# Patient Record
Sex: Male | Born: 1955 | Race: White | Hispanic: No | Marital: Married | Smoking: Never smoker
Health system: Southern US, Community
[De-identification: ages and names within clinical notes are randomized; demographics above are authoritative.]

## PROBLEM LIST (undated history)

## (undated) DIAGNOSIS — I10 Essential (primary) hypertension: Secondary | ICD-10-CM

## (undated) DIAGNOSIS — N2889 Other specified disorders of kidney and ureter: Secondary | ICD-10-CM

## (undated) DIAGNOSIS — C801 Malignant (primary) neoplasm, unspecified: Secondary | ICD-10-CM

## (undated) DIAGNOSIS — E7800 Pure hypercholesterolemia, unspecified: Secondary | ICD-10-CM

## (undated) DIAGNOSIS — E119 Type 2 diabetes mellitus without complications: Secondary | ICD-10-CM

## (undated) HISTORY — PX: RENAL CRYOABLATION: SHX2322

---

## 1996-05-02 HISTORY — PX: OTHER SURGICAL HISTORY: SHX169

## 2014-08-18 ENCOUNTER — Other Ambulatory Visit (HOSPITAL_BASED_OUTPATIENT_CLINIC_OR_DEPARTMENT_OTHER): Payer: Self-pay | Admitting: Family Medicine

## 2014-08-18 DIAGNOSIS — R509 Fever, unspecified: Secondary | ICD-10-CM

## 2014-08-18 DIAGNOSIS — D72820 Lymphocytosis (symptomatic): Secondary | ICD-10-CM

## 2014-08-19 ENCOUNTER — Other Ambulatory Visit (HOSPITAL_BASED_OUTPATIENT_CLINIC_OR_DEPARTMENT_OTHER): Payer: 59

## 2014-08-20 ENCOUNTER — Other Ambulatory Visit (HOSPITAL_BASED_OUTPATIENT_CLINIC_OR_DEPARTMENT_OTHER): Payer: Self-pay | Admitting: Family Medicine

## 2014-08-20 ENCOUNTER — Telehealth: Payer: Self-pay | Admitting: Hematology & Oncology

## 2014-08-20 DIAGNOSIS — A689 Relapsing fever, unspecified: Secondary | ICD-10-CM

## 2014-08-20 DIAGNOSIS — R7989 Other specified abnormal findings of blood chemistry: Secondary | ICD-10-CM

## 2014-08-20 NOTE — Telephone Encounter (Signed)
Wife called wanting our MD to see pt. They are getting the run around from [facility] Dr. Creig Hines office. Wife is aware to get records sent to [place] and we will see the pt or to come in and fill out medical release and we will get the records.

## 2014-08-21 ENCOUNTER — Ambulatory Visit (HOSPITAL_BASED_OUTPATIENT_CLINIC_OR_DEPARTMENT_OTHER)
Admission: RE | Admit: 2014-08-21 | Discharge: 2014-08-21 | Disposition: A | Discharge status: Home / Self Care | Payer: 59 | Source: Ambulatory Visit | Attending: Family Medicine | Admitting: Family Medicine

## 2014-08-21 DIAGNOSIS — R7989 Other specified abnormal findings of blood chemistry: Secondary | ICD-10-CM

## 2014-08-21 DIAGNOSIS — D72820 Lymphocytosis (symptomatic): Secondary | ICD-10-CM

## 2014-08-21 DIAGNOSIS — D72829 Elevated white blood cell count, unspecified: Secondary | ICD-10-CM | POA: Diagnosis not present

## 2014-08-21 DIAGNOSIS — R109 Unspecified abdominal pain: Secondary | ICD-10-CM | POA: Insufficient documentation

## 2014-08-21 DIAGNOSIS — R509 Fever, unspecified: Secondary | ICD-10-CM | POA: Diagnosis not present

## 2014-08-21 DIAGNOSIS — A689 Relapsing fever, unspecified: Secondary | ICD-10-CM

## 2014-08-21 MED ORDER — IOHEXOL 300 MG/ML  SOLN
100.0000 mL | Freq: Once | INTRAMUSCULAR | Status: AC | PRN
  Administered 2014-08-21: 100 mL via INTRAVENOUS

## 2014-08-25 ENCOUNTER — Telehealth: Payer: Self-pay | Admitting: Hematology & Oncology

## 2014-08-25 NOTE — Telephone Encounter (Signed)
Wife left message pt got CT and will follow up with surgeon. Dr. Marin Olp has seen the CT results and agrees he doesn't need to see pt at this time.

## 2014-08-27 ENCOUNTER — Telehealth: Payer: Self-pay | Admitting: Hematology & Oncology

## 2014-08-27 ENCOUNTER — Telehealth: Payer: Self-pay | Admitting: *Deleted

## 2014-08-27 NOTE — Telephone Encounter (Signed)
Received phone call from Patient wife who is a Designer, jewellery.  She is concerned about when patient was hospitalized back in March for Sepsis- EColi in his blood and his labs (platelets and Wbc) fluctuated.  She is concerned with why he had this and states she cant get answers from anyone.  She wants to see Dr. Niel Hummer for consult but does not want me to call the other offices for notes, etc because it hurts her NP practice.  She states she will send [place] notes herself from appropriate offices and we will check with Dr. Marin Olp to see if he thinks patient should be seen.This was satisfactory with the patient

## 2014-08-27 NOTE — Telephone Encounter (Signed)
i transferred call to RN pt's there is something going on with pt's caregivers and his wife.

## 2014-09-30 ENCOUNTER — Other Ambulatory Visit: Payer: Self-pay | Admitting: Urology

## 2014-09-30 DIAGNOSIS — N2889 Other specified disorders of kidney and ureter: Secondary | ICD-10-CM

## 2014-10-23 ENCOUNTER — Ambulatory Visit
Admission: RE | Admit: 2014-10-23 | Discharge: 2014-10-23 | Disposition: A | Discharge status: Home / Self Care | Payer: 59 | Source: Ambulatory Visit | Attending: Urology | Admitting: Urology

## 2014-10-23 DIAGNOSIS — N2889 Other specified disorders of kidney and ureter: Secondary | ICD-10-CM | POA: Insufficient documentation

## 2014-10-23 HISTORY — DX: Other specified disorders of kidney and ureter: N28.89

## 2014-10-23 HISTORY — DX: Pure hypercholesterolemia, unspecified: E78.00

## 2014-10-23 HISTORY — DX: Essential (primary) hypertension: I10

## 2014-10-23 NOTE — Consult Note (Signed)
Chief Complaint: Chief Complaint  Patient presents with  . Advice Only    Consult for Left Renal Cryoablation      Referring Physician(s): Hall,Marshall C  History of Present Illness: Terry Hunt is a 59 y.o. male who was recently evaluated for abdominal pain which led to a CT scan. The CT scan discovered an incidental 1.8 cm left upper pole solid enhancing renal mass compatible with a renal cell carcinoma. This was further evaluated with MRI again confirming a 2 cm enhancing solid exophytic left upper pole renal mass consistent with a renal cell carcinoma. He remains asymptomatic. No flank pain, abdominal pain, dysuria or hematuria. He is referred from Urology to discuss interventional therapies including image guided cryoablation.  Past Medical History  Diagnosis Date  . Left renal mass   . Hypertension   . Hypercholesterolemia     Past Surgical History  Procedure Laterality Date  . Left hand closed reduction    1998      Allergies: Review of patient's allergies indicates no known allergies.  Medications: Prior to Admission medications   Medication Sig Start Date End Date Taking? Authorizing Provider  aspirin 81 MG tablet Take 81 mg by mouth daily.   Yes Historical Provider, MD  esomeprazole (NEXIUM) 40 MG capsule Take 40 mg by mouth daily at 12 noon.   Yes Historical Provider, MD  losartan-hydrochlorothiazide (HYZAAR) 100-25 MG per tablet Take 1 tablet by mouth daily.   Yes Historical Provider, MD  simvastatin (ZOCOR) 20 MG tablet Take 20 mg by mouth daily.   Yes Historical Provider, MD     No family history on file.  History   Social History  . Marital Status: Married    Spouse Name: N/A  . Number of Children: N/A  . Years of Education: N/A   Social History Main Topics  . Smoking status: Never Smoker   . Smokeless tobacco: Never Used  . Alcohol Use: No  . Drug Use: No  . Sexual Activity: Not on file   Other Topics Concern  . None   Social History  Narrative  . None    ECOG Status: 0 - Asymptomatic  Review of Systems: A 12 point ROS discussed and pertinent positives are indicated in the HPI above.  All other systems are negative.  Review of Systems  Vital Signs: BP 178/91 mmHg  Pulse 76  Temp(Src) 98.2 F (36.8 C) (Oral)  Resp 14  Ht 6' (1.829 m)  Wt 190 lb (86.183 kg)  BMI 25.76 kg/m2  SpO2 98%  Physical Exam  Constitutional: He appears well-developed and well-nourished. No distress.  Cardiovascular: Normal rate, regular rhythm and normal heart sounds.  Exam reveals no friction rub.   No murmur heard. Pulmonary/Chest: Effort normal and breath sounds normal. No respiratory distress. He has no wheezes.  Abdominal: Soft. Bowel sounds are normal. He exhibits no distension and no mass.  Skin: He is not diaphoretic.     Imaging: Recent CT and MRI scans were reviewed again confirming a 2 cm solid enhancing left upper pole renal mass compatible with a small renal cell carcinoma.   Labs: Creatinine 0.9 09/03/2014   Assessment and Plan:  2 cm left upper pole posterior renal cell carcinoma by imaging. Posterior peripheral location is amenable to image guided cryoablation. Treatment options including partial nephrectomy, surveillance, and cryoablation were reviewed. All questions were addressed. Our discussion centered around cryoablation. The procedure, risks, benefits, alternatives, outcomes and surveillance were reviewed with the patient  and his spouse. They have a clear understanding of the procedure. He would like to proceed with the treatment at [facility]. After our discussion, this will be scheduled in the next few weeks.  Thank you for this interesting consult.  I greatly enjoyed meeting Terry Hunt and look forward to participating in their care.  SignedGreggory Keen 10/23/2014, 2:10 PM   I spent a total of  30 Minutes   in face to face in clinical consultation, greater than 50% of which was  counseling/coordinating care for this patient with a 2 cm left renal cell carcinoma

## 2014-11-20 ENCOUNTER — Other Ambulatory Visit (HOSPITAL_COMMUNITY): Payer: Self-pay | Admitting: Interventional Radiology

## 2014-11-20 DIAGNOSIS — N2889 Other specified disorders of kidney and ureter: Secondary | ICD-10-CM

## 2015-02-02 ENCOUNTER — Other Ambulatory Visit: Payer: Self-pay | Admitting: Radiology

## 2015-02-02 ENCOUNTER — Other Ambulatory Visit (HOSPITAL_COMMUNITY): Payer: Self-pay | Admitting: Interventional Radiology

## 2015-02-02 DIAGNOSIS — N2889 Other specified disorders of kidney and ureter: Secondary | ICD-10-CM

## 2015-02-10 ENCOUNTER — Other Ambulatory Visit (HOSPITAL_COMMUNITY): Payer: Self-pay | Admitting: Interventional Radiology

## 2015-02-10 LAB — BUN: BUN: 15 mg/dL (ref 7–25)

## 2015-02-10 LAB — CREATININE WITH EST GFR
CREATININE: 0.97 mg/dL (ref 0.70–1.25)
GFR, Est African American: >89 mL/min (ref >=60)
GFR, Est Non African American: 84 mL/min (ref >=60)

## 2015-02-13 ENCOUNTER — Ambulatory Visit (HOSPITAL_BASED_OUTPATIENT_CLINIC_OR_DEPARTMENT_OTHER)
Admission: RE | Admit: 2015-02-13 | Discharge: 2015-02-13 | Disposition: A | Discharge status: Home / Self Care | Payer: 59 | Source: Ambulatory Visit | Attending: Interventional Radiology | Admitting: Interventional Radiology

## 2015-02-13 DIAGNOSIS — C642 Malignant neoplasm of left kidney, except renal pelvis: Secondary | ICD-10-CM | POA: Insufficient documentation

## 2015-02-13 DIAGNOSIS — N2889 Other specified disorders of kidney and ureter: Secondary | ICD-10-CM

## 2015-02-13 MED ORDER — IOHEXOL 350 MG/ML SOLN
100.0000 mL | Freq: Once | INTRAVENOUS | Status: AC | PRN
  Administered 2015-02-13: 100 mL via INTRAVENOUS

## 2015-02-20 ENCOUNTER — Other Ambulatory Visit (HOSPITAL_BASED_OUTPATIENT_CLINIC_OR_DEPARTMENT_OTHER): Payer: 59

## 2015-02-24 ENCOUNTER — Ambulatory Visit
Admission: RE | Admit: 2015-02-24 | Discharge: 2015-02-24 | Disposition: A | Discharge status: Home / Self Care | Payer: 59 | Source: Ambulatory Visit | Attending: Interventional Radiology | Admitting: Interventional Radiology

## 2015-02-24 DIAGNOSIS — N2889 Other specified disorders of kidney and ureter: Secondary | ICD-10-CM

## 2015-02-24 NOTE — Progress Notes (Signed)
Patient ID: Terry Hunt, male   DOB: October 31, 1955, 59 y.o.   MRN: [id number]       Chief Complaint: Patient was seen in consultation today for  Chief Complaint  Patient presents with  . Follow-up    Cryoablation of Left Renal Mass   at the request of Deangleo Passage  Referring Physician(s): hall  History of Present Illness: Terry Hunt is a 59 y.o. male  3 months status post left renal cell carcinoma cryoablation. Procedure was performed at [facility]. He was discharged the same day. He has recovered at home very well. No current abdominal or flank pain. No dysuria or hematuria. No current urinary tract symptoms. He currently is asymptomatic. Follow-up CT imaging demonstrates no significant residual or recurrent tumor. No delay, location or hydronephrosis. He returns for outpatient follow-up.   Past Medical History  Diagnosis Date  . Left renal mass   . Hypertension   . Hypercholesterolemia     Past Surgical History  Procedure Laterality Date  . Left hand closed reduction    1998      Allergies: Review of patient's allergies indicates no known allergies.  Medications: Prior to Admission medications   Medication Sig Start Date End Date Taking? Authorizing Provider  aspirin 81 MG tablet Take 81 mg by mouth daily.    Historical Provider, MD  esomeprazole (NEXIUM) 40 MG capsule Take 40 mg by mouth daily at 12 noon.    Historical Provider, MD  losartan-hydrochlorothiazide (HYZAAR) 100-25 MG per tablet Take 1 tablet by mouth daily.    Historical Provider, MD  simvastatin (ZOCOR) 20 MG tablet Take 20 mg by mouth daily.    Historical Provider, MD     No family history on file.  Social History   Social History  . Marital Status: Married    Spouse Name: N/A  . Number of Children: N/A  . Years of Education: N/A   Social History Main Topics  . Smoking status: Never Smoker   . Smokeless tobacco: Never Used  . Alcohol Use: No  . Drug Use: No  . Sexual Activity: Not on  file   Other Topics Concern  . Not on file   Social History Narrative  . No narrative on file    ECOG Status: 0 - Asymptomatic  Review of Systems: A 12 point ROS discussed and pertinent positives are indicated in the HPI above.  All other systems are negative.  Review of Systems  Vital Signs: BP 170/88 mmHg  Pulse 63  Temp(Src) 98 F (36.7 C) (Oral)  Resp 14  SpO2 98%  Physical Exam  Constitutional: He appears well-developed and well-nourished. No distress.  Cardiovascular: Normal rate and regular rhythm.  Exam reveals no friction rub.   No murmur heard. Pulmonary/Chest: Effort normal and breath sounds normal. No respiratory distress. He has no wheezes.  Abdominal: Soft. Bowel sounds are normal. He exhibits no distension and no mass. There is no guarding.  Skin: He is not diaphoretic.     Imaging: Ct Abd Wo & W Cm  02/13/2015  CLINICAL DATA:  Status post RF ablation of a left upper pole renal cell carcinoma. EXAM: CT ABDOMEN WITHOUT AND WITH CONTRAST TECHNIQUE: Multidetector CT imaging of the abdomen was performed following the standard protocol before and following the bolus administration of intravenous contrast. CONTRAST:  171mL OMNIPAQUE IOHEXOL 350 MG/ML SOLN COMPARISON:  Ablation procedure CT scan 11/13/2014, MRI abdomen 09/12/2014 and CT scan 08/21/2014 FINDINGS: Lower chest: The lung bases are clear of  acute findings other than minimal dependent subpleural atelectasis. No worrisome pulmonary lesions or pleural effusion. The heart is normal in size. No pericardial effusion. The distal esophagus is grossly normal. There is a small hiatal hernia. Hepatobiliary: No focal hepatic lesions or intrahepatic biliary dilatation. The gallbladder is normal. No common bile duct dilatation. Pancreas: No mass, inflammation or ductal dilatation. Spleen: Normal size.  No focal lesions. Adrenals/Urinary Tract: The adrenal glands are normal and stable. The right kidney is normal and stable.  Post ablation changes involving the left upper pole renal lesion. It measures 19 x 18 mm. I do not see any appreciable enhancement after contrast administration to suggest recurrent or residual tumor. Stomach/Bowel: The stomach, duodenum, visualized small bowel and visualized colon are unremarkable. No inflammatory changes, mass lesions or obstructive findings. Vascular/Lymphatic: The aorta and branch vessels are patent. The major venous structures are patent. No mesenteric or retroperitoneal mass or lymphadenopathy. Other: No ascites or abdominal wall hernia. Musculoskeletal: No significant osseous abnormalities. IMPRESSION: Status post RF ablation of a left upper pole renal cell carcinoma. No CT findings to suggest residual or recurrent tumor. No findings for metastatic disease or adenopathy. Electronically Signed   By: Marijo Sanes M.D.   On: 02/13/2015 10:52    Labs:  CBC: No results for input(s): WBC, HGB, HCT, PLT in the last 8760 hours.  COAGS: No results for input(s): INR, APTT in the last 8760 hours.  BMP:  Recent Labs  02/10/15 0001  BUN 15  CREATININE 0.97  GFRNONAA 84  GFRAA >89     Assessment and Plan:  3 months status post left upper pole 2 cm renal cell carcinoma cryoablation. He has recovered fully. He remains asymptomatic. No current abdominal or flank pain. No hematuria or urinary tract symptoms. Repeat imaging 02/13/2015 demonstrates no residual or recurrent tumor. Stable ablation defect. No delay complication or hemorrhage. No hydronephrosis.  Plan: Schedule for follow-up repeat CT without and with contrast at [facility] [place] in 6 months. I will see him back in the office after the six-month CT to review his imaging.   Thank you for this interesting consult.  I greatly enjoyed meeting Eustacio Ellen and look forward to participating in their care.  A copy of this report was sent to the requesting provider on this date.  SignedGreggory Keen 02/24/2015, 4:50  PM   I spent a total of    15 Minutes in face to face in clinical consultation, greater than 50% of which was counseling/coordinating care for this patient with left renal cell carcinoma, status post cryoablation

## 2015-05-12 ENCOUNTER — Other Ambulatory Visit (HOSPITAL_COMMUNITY): Payer: Self-pay | Admitting: Interventional Radiology

## 2015-05-12 DIAGNOSIS — N2889 Other specified disorders of kidney and ureter: Secondary | ICD-10-CM

## 2015-07-21 ENCOUNTER — Other Ambulatory Visit: Payer: Self-pay | Admitting: *Deleted

## 2015-07-21 ENCOUNTER — Other Ambulatory Visit (HOSPITAL_COMMUNITY): Payer: Self-pay | Admitting: Interventional Radiology

## 2015-07-21 DIAGNOSIS — C642 Malignant neoplasm of left kidney, except renal pelvis: Secondary | ICD-10-CM

## 2015-08-07 ENCOUNTER — Other Ambulatory Visit: Payer: Self-pay | Admitting: *Deleted

## 2015-08-19 LAB — CREATININE WITH EST GFR
CREATININE: 0.97 mg/dL (ref 0.70–1.25)
GFR, Est Non African American: 84 mL/min (ref >=60)

## 2015-08-19 LAB — BUN: BUN: 14 mg/dL (ref 7–25)

## 2015-08-21 ENCOUNTER — Encounter (HOSPITAL_BASED_OUTPATIENT_CLINIC_OR_DEPARTMENT_OTHER): Payer: Self-pay

## 2015-08-21 ENCOUNTER — Ambulatory Visit (HOSPITAL_BASED_OUTPATIENT_CLINIC_OR_DEPARTMENT_OTHER)
Admission: RE | Admit: 2015-08-21 | Discharge: 2015-08-21 | Disposition: A | Discharge status: Home / Self Care | Payer: 59 | Source: Ambulatory Visit | Attending: Interventional Radiology | Admitting: Interventional Radiology

## 2015-08-21 DIAGNOSIS — C642 Malignant neoplasm of left kidney, except renal pelvis: Secondary | ICD-10-CM | POA: Diagnosis not present

## 2015-08-21 HISTORY — DX: Type 2 diabetes mellitus without complications: E11.9

## 2015-08-21 HISTORY — DX: Malignant (primary) neoplasm, unspecified: C80.1

## 2015-08-21 MED ORDER — IOPAMIDOL (ISOVUE-300) INJECTION 61%
100.0000 mL | Freq: Once | INTRAVENOUS | Status: AC | PRN
Start: 2015-08-21 — End: 2015-08-21
  Administered 2015-08-21: 100 mL via INTRAVENOUS

## 2015-08-26 ENCOUNTER — Ambulatory Visit
Admission: RE | Admit: 2015-08-26 | Discharge: 2015-08-26 | Disposition: A | Discharge status: Home / Self Care | Payer: 59 | Source: Ambulatory Visit | Attending: Interventional Radiology | Admitting: Interventional Radiology

## 2015-08-26 DIAGNOSIS — N2889 Other specified disorders of kidney and ureter: Secondary | ICD-10-CM

## 2015-08-26 NOTE — Progress Notes (Signed)
Patient ID: Terry Hunt, male   DOB: [birth year], 60 y.o.   MRN: [id number]       Chief Complaint: Left renal cell carcinoma, status post cryoablation.   Referring Physician(s): Heather Roberts  History of Present Illness: Terry Hunt is a 60 y.o. male 9 months status post left renal cell carcinoma cryoablation. He continues to do very well as an outpatient. No physical limitations. No current flank or abdominal pain. No dysuria or hematuria. No current urinary tract symptoms. He returns to review follow-up imaging as an outpatient.  Past Medical History  Diagnosis Date  . Left renal mass   . Hypertension   . Hypercholesterolemia   . Diabetes mellitus without complication ([place])   . Cancer [facility])     Past Surgical History  Procedure Laterality Date  . Left hand closed reduction    1998      Allergies: Review of patient's allergies indicates no known allergies.  Medications: Prior to Admission medications   Medication Sig Start Date End Date Taking? Authorizing Provider  aspirin 81 MG tablet Take 81 mg by mouth daily.   Yes Historical Provider, MD  esomeprazole (NEXIUM) 40 MG capsule Take 40 mg by mouth daily at 12 noon.   Yes Historical Provider, MD  losartan-hydrochlorothiazide (HYZAAR) 100-25 MG per tablet Take 1 tablet by mouth daily.   Yes Historical Provider, MD  metFORMIN (GLUCOPHAGE) 500 MG tablet Take by mouth 2 (two) times daily with a meal.   Yes Historical Provider, MD  simvastatin (ZOCOR) 20 MG tablet Take 20 mg by mouth daily.    Historical Provider, MD     No family history on file.  Social History   Social History  . Marital Status: Married    Spouse Name: N/A  . Number of Children: N/A  . Years of Education: N/A   Social History Main Topics  . Smoking status: Never Smoker   . Smokeless tobacco: Never Used  . Alcohol Use: No  . Drug Use: No  . Sexual Activity: Not on file   Other Topics Concern  . Not on file   Social History Narrative    ECOG  Status: 0 - Asymptomatic  Review of Systems: A 12 point ROS discussed and pertinent positives are indicated in the HPI above.  All other systems are negative.  Review of Systems  Vital Signs: BP 178/85 mmHg  Pulse 65  Temp(Src) 98.1 F (36.7 C) (Oral)  Resp 14  Ht 6\' 1"  (1.854 m)  Wt 185 lb (83.915 kg)  BMI 24.41 kg/m2  SpO2 97%  Physical Exam  Constitutional: He appears well-developed and well-nourished. No distress.  Cardiovascular: Normal rate and regular rhythm.  Exam reveals no friction rub.   No murmur heard. Pulmonary/Chest: Effort normal and breath sounds normal. No respiratory distress. He has no wheezes.  Abdominal: Soft. Bowel sounds are normal. He exhibits no distension and no mass. There is no tenderness. No hernia.  Skin: He is not diaphoretic.  Psychiatric: He has a normal mood and affect. His behavior is normal.    Imaging: Ct Abd Wo & W Cm  08/21/2015  CLINICAL DATA:  Left renal cell carcinoma, status post cryoablation on 11/13/2014. EXAM: CT ABDOMEN WITHOUT AND WITH CONTRAST TECHNIQUE: Multidetector CT imaging of the abdomen was performed following the standard protocol before and following the bolus administration of intravenous contrast. CONTRAST:  160mL ISOVUE-300 IOPAMIDOL (ISOVUE-300) INJECTION 61% COMPARISON:  02/13/2015. FINDINGS: Lower chest: Stable 3 mm nodule along the left major fissure  are compatible with subpleural lymph node. Hepatobiliary: No focal abnormality within the liver parenchyma. There is no evidence for gallstones, gallbladder wall thickening, or pericholecystic fluid. No intrahepatic or extrahepatic biliary dilation. Pancreas: No focal mass lesion. No dilatation of the main duct. No intraparenchymal cyst. No peripancreatic edema. Spleen: No splenomegaly. No focal mass lesion. Adrenals/Urinary Tract: No adrenal nodule or mass. Right kidney is unremarkable. Ablation defect in the upper pole left kidney posteriorly has decreased in size in the  interval, measuring 1.3 x 1.5 cm today compared at 1.8 x 1.9 cm previously. No definite enhancement within the ablation defect. No hydronephrosis in either kidney. Stomach/Bowel: Stomach is nondistended. No gastric wall thickening. No evidence of outlet obstruction. Duodenum is normally positioned as is the ligament of Treitz. Visualized abdominal bowel loops and colonic segments are unremarkable. Vascular/Lymphatic: There is abdominal aortic atherosclerosis without aneurysm. There is no gastrohepatic or hepatoduodenal ligament lymphadenopathy. No intraperitoneal or retroperitoneal lymphadenopathy. Other: No intraperitoneal free fluid. Musculoskeletal: Bone windows reveal no worrisome lytic or sclerotic osseous lesions. IMPRESSION: Continued further retraction of the ablation defect in the upper pole left kidney consistent with evolution of scarring. No features on today's study to raise concern for recurrent or metastatic disease. Electronically Signed   By: Misty Stanley M.D.   On: 08/21/2015 12:58    Labs:  BMP:  Recent Labs  02/10/15 0001 08/18/15 1459  BUN 15 14  CREATININE 0.97 0.97  GFRNONAA 84 84  GFRAA >89 >89     Assessment and Plan:  9 months status post left renal cell carcinoma CT-guided cryoablation. He continues to do very well. No current flank or abdominal pain. No dysuria or hematuria. No current urinary tract symptoms. He continues to work full-time. No physical limitations. Follow-up CT imaging demonstrates further retraction of the ablation defect. No signs of recurrence or residual tumor.  Plan: Repeat surveillance CT without and with contrast at [facility] [place] in 6 months. I will see him back in the office at that time.  Thank you for this interesting consult.  I greatly enjoyed meeting Terry Hunt and look forward to participating in their care.  A copy of this report was sent to the requesting provider on this date.  Electronically Signed: Greggory Keen 08/26/2015, 3:51 PM   I spent a total of    25 Minutes in face to face in clinical consultation, greater than 50% of which was counseling/coordinating care for this patient with a left renal cell carcinoma

## 2015-09-27 ENCOUNTER — Emergency Department (HOSPITAL_BASED_OUTPATIENT_CLINIC_OR_DEPARTMENT_OTHER)
Admission: EM | Admit: 2015-09-27 | Discharge: 2015-09-27 | Disposition: A | Discharge status: Home / Self Care | Payer: 59 | Attending: Emergency Medicine | Admitting: Emergency Medicine

## 2015-09-27 ENCOUNTER — Encounter (HOSPITAL_BASED_OUTPATIENT_CLINIC_OR_DEPARTMENT_OTHER): Payer: Self-pay

## 2015-09-27 DIAGNOSIS — Z7982 Long term (current) use of aspirin: Secondary | ICD-10-CM | POA: Insufficient documentation

## 2015-09-27 DIAGNOSIS — E119 Type 2 diabetes mellitus without complications: Secondary | ICD-10-CM | POA: Insufficient documentation

## 2015-09-27 DIAGNOSIS — I10 Essential (primary) hypertension: Secondary | ICD-10-CM | POA: Diagnosis not present

## 2015-09-27 DIAGNOSIS — M5416 Radiculopathy, lumbar region: Secondary | ICD-10-CM | POA: Insufficient documentation

## 2015-09-27 DIAGNOSIS — M5410 Radiculopathy, site unspecified: Secondary | ICD-10-CM

## 2015-09-27 DIAGNOSIS — Z7984 Long term (current) use of oral hypoglycemic drugs: Secondary | ICD-10-CM | POA: Insufficient documentation

## 2015-09-27 DIAGNOSIS — M545 Low back pain: Secondary | ICD-10-CM | POA: Diagnosis present

## 2015-09-27 MED ORDER — PREDNISONE 10 MG PO TABS: 20.0000 mg | ORAL_TABLET | Freq: Two times a day (BID) | ORAL | Status: DC

## 2015-09-27 MED ORDER — HYDROCODONE-ACETAMINOPHEN 5-325 MG PO TABS: 1.0000 | ORAL_TABLET | Freq: Four times a day (QID) | ORAL | Status: AC | PRN

## 2015-09-27 NOTE — ED Provider Notes (Signed)
CSN: [id number]     Arrival date & time 09/27/15  0156 History   First MD Initiated Contact with Patient 09/27/15 0302     Chief Complaint  Patient presents with  . Leg Pain     (Consider location/radiation/quality/duration/timing/severity/associated sxs/prior Treatment) HPI Comments: Patient is a 60 year old male with history of diabetes. He presents for evaluation of low back pain radiating into his left leg. This began several days ago in the absence of any injury or trauma. He denies any weakness or numbness. He denies any bowel or bladder complaints.  Patient is a 60 y.o. male presenting with back pain. The history is provided by the patient.  Back Pain Location:  Lumbar spine Quality:  Stabbing Radiates to:  L thigh and L posterior upper leg Pain severity:  Moderate Onset quality:  Sudden Duration:  3 days Timing:  Constant Progression:  Worsening Chronicity:  New Context: not falling, not lifting heavy objects and not occupational injury   Relieved by:  Nothing Worsened by:  Bending, ambulation and palpation Ineffective treatments:  Ibuprofen Associated symptoms: no bladder incontinence, no bowel incontinence, no numbness, no paresthesias and no weakness     Past Medical History  Diagnosis Date  . Left renal mass   . Hypertension   . Hypercholesterolemia   . Diabetes mellitus without complication ([place])   . Cancer [facility])    Past Surgical History  Procedure Laterality Date  . Left hand closed reduction    1998     No family history on file. Social History  Substance Use Topics  . Smoking status: Never Smoker   . Smokeless tobacco: Never Used  . Alcohol Use: No    Review of Systems  Gastrointestinal: Negative for bowel incontinence.  Genitourinary: Negative for bladder incontinence.  Musculoskeletal: Positive for back pain.  Neurological: Negative for weakness, numbness and paresthesias.  All other systems reviewed and are negative.     Allergies   Review of patient's allergies indicates no known allergies.  Home Medications   Prior to Admission medications   Medication Sig Start Date End Date Taking? Authorizing Provider  aspirin 81 MG tablet Take 81 mg by mouth daily.    Historical Provider, MD  esomeprazole (NEXIUM) 40 MG capsule Take 40 mg by mouth daily at 12 noon.    Historical Provider, MD  HYDROcodone-acetaminophen (NORCO) 5-325 MG tablet Take 1-2 tablets by mouth every 6 (six) hours as needed. 09/27/15   Veryl Speak, MD  losartan-hydrochlorothiazide (HYZAAR) 100-25 MG per tablet Take 1 tablet by mouth daily.    Historical Provider, MD  metFORMIN (GLUCOPHAGE) 500 MG tablet Take by mouth 2 (two) times daily with a meal.    Historical Provider, MD  predniSONE (DELTASONE) 10 MG tablet Take 2 tablets (20 mg total) by mouth 2 (two) times daily. 09/27/15   Veryl Speak, MD  simvastatin (ZOCOR) 20 MG tablet Take 20 mg by mouth daily.    Historical Provider, MD   BP 179/90 mmHg  Pulse 80  Temp(Src) 98.1 F (36.7 C) (Oral)  Resp 16  Ht 6\' 1"  (1.854 m)  Wt 183 lb (83.008 kg)  BMI 24.15 kg/m2  SpO2 100% Physical Exam  Constitutional: He is oriented to person, place, and time. He appears well-developed and well-nourished. No distress.  HENT:  Head: Normocephalic and atraumatic.  Neck: Normal range of motion. Neck supple.  Musculoskeletal:  There is tenderness to palpation in the soft tissues of the left lower lumbar region.  Neurological: He is alert  and oriented to person, place, and time.  DTRs are 2+ and symmetrical in the patellar and Achilles tendons bilaterally. Strength is 5 out of 5 in both lower extremities. He is able to ambulate on his heels and toes without difficulty.  Skin: Skin is warm and dry. He is not diaphoretic.  Nursing note and vitals reviewed.   ED Course  Procedures (including critical care time) Labs Review Labs Reviewed - No data to display  Imaging Review No results found. I have personally  reviewed and evaluated these images and lab results as part of my medical decision-making.   EKG Interpretation None      MDM   Final diagnoses:  Radicular low back pain    I will treat with prednisone and pain medication for radicular back pain. There are no red flags that would suggest an emergent situation. He is to follow-up with his primary Dr. if not improving in the next week to 10 days.    Veryl Speak, MD 09/27/15 0330

## 2015-09-27 NOTE — ED Notes (Signed)
Pt reports burning pain down leg.

## 2015-09-27 NOTE — ED Notes (Signed)
Pt reports pain in left hip that radiates down leg to foot. No injury. Reports stretching helps the pain.

## 2015-09-27 NOTE — ED Notes (Signed)
C/o L lower back pain, with radiation down L leg, no h/o back surgery, back diagnoses, no meds PTA, denies urinary or other sx.

## 2015-09-27 NOTE — Discharge Instructions (Signed)
Prednisone as prescribed.  Hydrocodone as prescribed as needed for pain.  Follow-up with your primary Dr. if not improving in the next 1-2 weeks, and return to the ER if symptoms significantly worsen or change.   Radicular Pain Radicular pain in either the arm or leg is usually from a bulging or herniated disk in the spine. A piece of the herniated disk may press against the nerves as the nerves exit the spine. This causes pain which is felt at the tips of the nerves down the arm or leg. Other causes of radicular pain may include:  Fractures.  Heart disease.  Cancer.  An abnormal and usually degenerative state of the nervous system or nerves (neuropathy). Diagnosis may require CT or MRI scanning to determine the primary cause.  Nerves that start at the neck (nerve roots) may cause radicular pain in the outer shoulder and arm. It can spread down to the thumb and fingers. The symptoms vary depending on which nerve root has been affected. In most cases radicular pain improves with conservative treatment. Neck problems may require physical therapy, a neck collar, or cervical traction. Treatment may take many weeks, and surgery may be considered if the symptoms do not improve.  Conservative treatment is also recommended for sciatica. Sciatica causes pain to radiate from the lower back or buttock area down the leg into the foot. Often there is a history of back problems. Most patients with sciatica are better after 2 to 4 weeks of rest and other supportive care. Short term bed rest can reduce the disk pressure considerably. Sitting, however, is not a good position since this increases the pressure on the disk. You should avoid bending, lifting, and all other activities which make the problem worse. Traction can be used in severe cases. Surgery is usually reserved for patients who do not improve within the first months of treatment. Only take over-the-counter or prescription medicines for pain,  discomfort, or fever as directed by your caregiver. Narcotics and muscle relaxants may help by relieving more severe pain and spasm and by providing mild sedation. Cold or massage can give significant relief. Spinal manipulation is not recommended. It can increase the degree of disc protrusion. Epidural steroid injections are often effective treatment for radicular pain. These injections deliver medicine to the spinal nerve in the space between the protective covering of the spinal cord and back bones (vertebrae). Your caregiver can give you more information about steroid injections. These injections are most effective when given within two weeks of the onset of pain.  You should see your caregiver for follow up care as recommended. A program for neck and back injury rehabilitation with stretching and strengthening exercises is an important part of management.  SEEK IMMEDIATE MEDICAL CARE IF:  You develop increased pain, weakness, or numbness in your arm or leg.  You develop difficulty with bladder or bowel control.  You develop abdominal pain.   This information is not intended to replace advice given to you by your health care provider. Make sure you discuss any questions you have with your health care provider.   Document Released: 05/26/2004 Document Revised: 05/09/2014 Document Reviewed: 11/12/2014 Elsevier Interactive Patient Education [employer].

## 2016-02-09 ENCOUNTER — Other Ambulatory Visit (HOSPITAL_COMMUNITY): Payer: Self-pay | Admitting: Interventional Radiology

## 2016-02-09 ENCOUNTER — Other Ambulatory Visit: Payer: Self-pay | Admitting: *Deleted

## 2016-02-09 DIAGNOSIS — N2889 Other specified disorders of kidney and ureter: Secondary | ICD-10-CM

## 2016-02-18 LAB — BUN: BUN: 13 mg/dL (ref 7–25)

## 2016-02-18 LAB — CREATININE WITH EST GFR
Creat: 0.82 mg/dL (ref 0.70–1.25)
GFR, Est African American: >89 mL/min (ref >=60)
GFR, Est Non African American: >89 mL/min (ref >=60)

## 2016-02-19 ENCOUNTER — Ambulatory Visit (HOSPITAL_BASED_OUTPATIENT_CLINIC_OR_DEPARTMENT_OTHER)
Admission: RE | Admit: 2016-02-19 | Discharge: 2016-02-19 | Disposition: A | Discharge status: Home / Self Care | Payer: 59 | Source: Ambulatory Visit | Attending: Interventional Radiology | Admitting: Interventional Radiology

## 2016-02-19 ENCOUNTER — Encounter (HOSPITAL_BASED_OUTPATIENT_CLINIC_OR_DEPARTMENT_OTHER): Payer: Self-pay

## 2016-02-19 DIAGNOSIS — N2889 Other specified disorders of kidney and ureter: Secondary | ICD-10-CM | POA: Insufficient documentation

## 2016-02-19 MED ORDER — IOPAMIDOL (ISOVUE-300) INJECTION 61%
100.0000 mL | Freq: Once | INTRAVENOUS | Status: AC | PRN
  Administered 2016-02-19: 100 mL via INTRAVENOUS

## 2016-02-25 ENCOUNTER — Ambulatory Visit
Admission: RE | Admit: 2016-02-25 | Discharge: 2016-02-25 | Disposition: A | Discharge status: Home / Self Care | Payer: 59 | Source: Ambulatory Visit | Attending: Interventional Radiology | Admitting: Interventional Radiology

## 2016-02-25 DIAGNOSIS — N2889 Other specified disorders of kidney and ureter: Secondary | ICD-10-CM

## 2016-02-25 HISTORY — PX: IR GENERIC HISTORICAL: IMG1180011

## 2016-02-25 NOTE — Progress Notes (Signed)
Patient ID: Terry Hunt, male   DOB: Mar 15, 1956, 60 y.o.   MRN: [id number]    Chief Complaint:  15 month status post left renal cell carcinoma cryoablation.  Referring Physician(s): Jonette Eva  History of Present Illness: Terry Hunt is a 60 y.o. male who is now 15 months status post left renal cell carcinoma cryoablation. He continues to do very well. No physical limitations. He works for the [employer] and is retiring at the end of the year. No current flank or abdominal pain. No dysuria or hematuria. No other urinary tract symptoms. He returns to review his outpatient surveillance imaging.  Past Medical History:  Diagnosis Date  . Cancer ([place])   . Diabetes mellitus without complication ([place])   . Hypercholesterolemia   . Hypertension   . Left renal mass     Past Surgical History:  Procedure Laterality Date  . Left hand closed reduction    1998      Allergies: Review of patient's allergies indicates no known allergies.  Medications: Prior to Admission medications   Medication Sig Start Date End Date Taking? Authorizing Provider  aspirin 81 MG tablet Take 81 mg by mouth daily.   Yes Historical Provider, MD  atorvastatin (LIPITOR) 20 MG tablet Take 20 mg by mouth daily.   Yes Historical Provider, MD  esomeprazole (NEXIUM) 40 MG capsule Take 40 mg by mouth daily at 12 noon.   Yes Historical Provider, MD  losartan-hydrochlorothiazide (HYZAAR) 100-25 MG per tablet Take 1 tablet by mouth daily.   Yes Historical Provider, MD  metFORMIN (GLUCOPHAGE) 500 MG tablet Take by mouth 2 (two) times daily with a meal.   Yes Historical Provider, MD  HYDROcodone-acetaminophen (NORCO) 5-325 MG tablet Take 1-2 tablets by mouth every 6 (six) hours as needed. Patient not taking: Reported on 02/25/2016 09/27/15   Veryl Speak, MD  predniSONE (DELTASONE) 10 MG tablet Take 2 tablets (20 mg total) by mouth 2 (two) times daily. Patient not taking: Reported on 02/25/2016 09/27/15   Veryl Speak, MD    simvastatin (ZOCOR) 20 MG tablet Take 20 mg by mouth daily.    Historical Provider, MD     No family history on file.  Social History   Social History  . Marital status: Married    Spouse name: N/A  . Number of children: N/A  . Years of education: N/A   Social History Main Topics  . Smoking status: Never Smoker  . Smokeless tobacco: Never Used  . Alcohol use No  . Drug use: No  . Sexual activity: Not on file   Other Topics Concern  . Not on file   Social History Narrative  . No narrative on file    ECOG Status: 0 - Asymptomatic  Review of Systems: A 12 point ROS discussed and pertinent positives are indicated in the HPI above.  All other systems are negative.  Review of Systems  Vital Signs: BP (!) 163/78 (BP Location: Left Arm, Patient Position: Sitting, Cuff Size: Normal)   Pulse 64   Temp 98.1 F (36.7 C) (Oral)   Resp 14   Ht 6\' 1"  (1.854 m)   Wt 184 lb (83.5 kg)   SpO2 100%   BMI 24.28 kg/m   Physical Exam  Constitutional: He is oriented to person, place, and time. He appears well-developed and well-nourished. No distress.  Eyes: Conjunctivae are normal. No scleral icterus.  Cardiovascular: Normal rate, regular rhythm and normal heart sounds.   No murmur heard. Pulmonary/Chest: Effort normal  and breath sounds normal. No respiratory distress. He has no wheezes.  Abdominal: Soft. Bowel sounds are normal. He exhibits no distension and no mass. There is no tenderness. There is no guarding.  Musculoskeletal: He exhibits no edema or tenderness.  Neurological: He is alert and oriented to person, place, and time.  Skin: Skin is warm and dry. No rash noted. He is not diaphoretic. No erythema.  Psychiatric: He has a normal mood and affect. His behavior is normal.      Imaging: Ct Abdomen W Wo Contrast  Result Date: 02/19/2016 CLINICAL DATA:  LEFT renal mass. EXAM: CT ABDOMEN WITHOUT AND WITH CONTRAST TECHNIQUE: Multidetector CT imaging of the abdomen was  performed following the standard protocol before and following the bolus administration of intravenous contrast. CONTRAST:  172mL ISOVUE-300 IOPAMIDOL (ISOVUE-300) INJECTION 61% COMPARISON:  08/21/2015 FINDINGS: Lower chest: Lung bases are clear. Hepatobiliary:  No focal hepatic lesion.  Normal gallbladder Pancreas: Pancreas is normal. No ductal dilatation. No pancreatic inflammation. Spleen: Normal spleen Adrenals/urinary tract: Adrenal glands are normal. No nephrolithiasis or ureterolithiasis. Cryoablation site in posterior aspect of the lower pole LEFT kidney measures 16 by 13 mm compared to 16 x 14 mm for no change. Lesion demonstrates no post-contrast enhancement. No enhancing renal lesion LEFT RIGHT kidney. Stomach/Bowel: Stomach and limited view of the bowel is unremarkable. Vascular/Lymphatic: Abdominal aorta is normal. No retroperitoneal lymphadenopathy. Other: No free fluid. Musculoskeletal: No aggressive osseous lesion. IMPRESSION: 1. Stable cryoablation site upper pole LEFT kidney. 2. No evidence of local recurrence or metastasis in the abdomen. Electronically Signed   By: Suzy Bouchard M.D.   On: 02/19/2016 10:05    Labs:  CBC: No results for input(s): WBC, HGB, HCT, PLT in the last 8760 hours.  COAGS: No results for input(s): INR, APTT in the last 8760 hours.  BMP:  Recent Labs  08/18/15 1459 02/18/16 0816  BUN 14 13  CREATININE 0.97 0.82  GFRNONAA 84 >89  GFRAA >89 >89    LIVER FUNCTION TESTS: No results for input(s): BILITOT, AST, ALT, ALKPHOS, PROT, ALBUMIN in the last 8760 hours.  TUMOR MARKERS: No results for input(s): AFPTM, CEA, CA199, CHROMGRNA in the last 8760 hours.  Assessment and Plan:  15 months status post left renal cell carcinoma CT-guided cryoablation. Surveillance imaging is stable without evidence of residual or recurrent tumor. He remains asymptomatic. No current flank or abdominal pain. No other urinary tract symptoms or hematuria. No physical  limitations.  Plan: Repeat surveillance imaging in July 2018 which will be 2 years post ablation.  Thank you for this interesting consult.  I greatly enjoyed meeting Addicus Langille and look forward to participating in their care.  A copy of this report was sent to the requesting provider on this date.  Electronically Signed: Greggory Keen 02/25/2016, 3:54 PM   I spent a total of    25 Minutes in face to face in clinical consultation, greater than 50% of which was counseling/coordinating care for this patient with a left renal cell carcinoma.

## 2016-03-22 ENCOUNTER — Encounter: Payer: Self-pay | Admitting: Interventional Radiology

## 2016-04-12 ENCOUNTER — Encounter (HOSPITAL_BASED_OUTPATIENT_CLINIC_OR_DEPARTMENT_OTHER): Payer: Self-pay | Admitting: *Deleted

## 2016-04-12 ENCOUNTER — Emergency Department (HOSPITAL_BASED_OUTPATIENT_CLINIC_OR_DEPARTMENT_OTHER)
Admission: EM | Admit: 2016-04-12 | Discharge: 2016-04-12 | Disposition: A | Discharge status: Home / Self Care | Payer: 59 | Attending: Emergency Medicine | Admitting: Emergency Medicine

## 2016-04-12 DIAGNOSIS — Z7984 Long term (current) use of oral hypoglycemic drugs: Secondary | ICD-10-CM | POA: Diagnosis not present

## 2016-04-12 DIAGNOSIS — E119 Type 2 diabetes mellitus without complications: Secondary | ICD-10-CM | POA: Insufficient documentation

## 2016-04-12 DIAGNOSIS — M5416 Radiculopathy, lumbar region: Secondary | ICD-10-CM | POA: Diagnosis not present

## 2016-04-12 DIAGNOSIS — M545 Low back pain: Secondary | ICD-10-CM | POA: Diagnosis present

## 2016-04-12 DIAGNOSIS — Z7982 Long term (current) use of aspirin: Secondary | ICD-10-CM | POA: Insufficient documentation

## 2016-04-12 DIAGNOSIS — I10 Essential (primary) hypertension: Secondary | ICD-10-CM | POA: Insufficient documentation

## 2016-04-12 DIAGNOSIS — Z79899 Other long term (current) drug therapy: Secondary | ICD-10-CM | POA: Insufficient documentation

## 2016-04-12 DIAGNOSIS — Z85528 Personal history of other malignant neoplasm of kidney: Secondary | ICD-10-CM | POA: Diagnosis not present

## 2016-04-12 MED ORDER — PREDNISONE 50 MG PO TABS
60.0000 mg | ORAL_TABLET | Freq: Once | ORAL | Status: AC
  Administered 2016-04-12: 60 mg via ORAL
  Filled 2016-04-12: qty 1

## 2016-04-12 MED ORDER — PREDNISONE 20 MG PO TABS: ORAL_TABLET | ORAL | 0 refills | Status: DC

## 2016-04-12 MED ORDER — OXYCODONE-ACETAMINOPHEN 5-325 MG PO TABS
1.0000 | ORAL_TABLET | Freq: Once | ORAL | Status: AC
  Administered 2016-04-12: 1 via ORAL
  Filled 2016-04-12: qty 1

## 2016-04-12 MED ORDER — OXYCODONE-ACETAMINOPHEN 5-325 MG PO TABS: 1.0000 | ORAL_TABLET | ORAL | 0 refills | Status: DC | PRN

## 2016-04-12 NOTE — ED Provider Notes (Signed)
[facility] Provider Note   CSN: [id number] Arrival date & time: 04/12/16  1221     History   Chief Complaint Chief Complaint  Patient presents with  . Back Pain    HPI Kahne Stanke is a 60 y.o. male.  The history is provided by the patient and medical records.  Back Pain     60 y.o. M with hx of renal cancer s/p renal cryoablation without recurrence, DM, HLP, HTN, presenting to the ED For left lower back pain. Patient states this began on Saturday and has been worsening since onset. Pain is localized to his left lower back, worse with change of position and movement. States when he has to stand up from a sitting position or began walking to the bathroom he has pain radiating down his left leg that is sharp and burning. He denies any numbness or weakness of his legs. No bowel or bladder incontinence. Patient reports similar episode over the summer which was treated with prednisone and oxycodone with relief in a matter of days. States current symptoms feel similar. He denies any recent injury, trauma, or falls. No prior back surgeries.  No fever, chills, sweats.  Has tried OTC meds without relief.  Past Medical History:  Diagnosis Date  . Cancer ([place])   . Diabetes mellitus without complication ([place])   . Hypercholesterolemia   . Hypertension   . Left renal mass     Patient Active Problem List   Diagnosis Date Noted  . Left renal mass     Past Surgical History:  Procedure Laterality Date  . IR GENERIC HISTORICAL  02/25/2016   IR RADIOLOGIST EVAL & MGMT 02/25/2016 Greggory Keen, MD [facility] INTERV RAD  . Left hand closed reduction    1998    . RENAL CRYOABLATION         Home Medications    Prior to Admission medications   Medication Sig Start Date End Date Taking? Authorizing Provider  aspirin 81 MG tablet Take 81 mg by mouth daily.   Yes Historical Provider, MD  atorvastatin (LIPITOR) 20 MG tablet Take 40 mg by mouth daily.    Yes Historical Provider, MD    esomeprazole (NEXIUM) 40 MG capsule Take 40 mg by mouth daily at 12 noon.   Yes Historical Provider, MD  losartan-hydrochlorothiazide (HYZAAR) 100-25 MG per tablet Take 1 tablet by mouth daily.   Yes Historical Provider, MD  metFORMIN (GLUCOPHAGE) 500 MG tablet Take by mouth 2 (two) times daily with a meal.   Yes Historical Provider, MD  metoprolol succinate (TOPROL-XL) 25 MG 24 hr tablet Take 25 mg by mouth daily.   Yes Historical Provider, MD  HYDROcodone-acetaminophen (NORCO) 5-325 MG tablet Take 1-2 tablets by mouth every 6 (six) hours as needed. Patient not taking: Reported on 02/25/2016 09/27/15   Veryl Speak, MD  predniSONE (DELTASONE) 10 MG tablet Take 2 tablets (20 mg total) by mouth 2 (two) times daily. Patient not taking: Reported on 02/25/2016 09/27/15   Veryl Speak, MD  simvastatin (ZOCOR) 20 MG tablet Take 20 mg by mouth daily.    Historical Provider, MD    Family History No family history on file.  Social History Social History  Substance Use Topics  . Smoking status: Never Smoker  . Smokeless tobacco: Never Used  . Alcohol use No     Allergies   Patient has no known allergies.   Review of Systems Review of Systems  Musculoskeletal: Positive for back pain.  All other  systems reviewed and are negative.    Physical Exam Updated Vital Signs BP 184/91 (BP Location: Right Arm)   Pulse 73   Temp 98.1 F (36.7 C) (Oral)   Resp 20   Ht 6\' 1"  (1.854 m)   Wt 81.6 kg   SpO2 100% Comment: Simultaneous filing. User may not have seen previous data.  BMI 23.75 kg/m   Physical Exam  Constitutional: He is oriented to person, place, and time. He appears well-developed and well-nourished.  HENT:  Head: Normocephalic and atraumatic.  Mouth/Throat: Oropharynx is clear and moist.  Eyes: Conjunctivae and EOM are normal. Pupils are equal, round, and reactive to light.  Neck: Normal range of motion.  Cardiovascular: Normal rate, regular rhythm and normal heart sounds.    Pulmonary/Chest: Effort normal and breath sounds normal. No respiratory distress. He has no wheezes.  Abdominal: Soft. Bowel sounds are normal. He exhibits no mass. There is no tenderness. There is no rebound.  Musculoskeletal: Normal range of motion.  Left lumbar paraspinal region including SI joint is tender to palpation with spasm noted; + SLR on left, negative on right; normal strength/sensation of both legs  Neurological: He is alert and oriented to person, place, and time.  Skin: Skin is warm and dry.  Psychiatric: He has a normal mood and affect.  Nursing note and vitals reviewed.    ED Treatments / Results  Labs (all labs ordered are listed, but only abnormal results are displayed) Labs Reviewed - No data to display  EKG  EKG Interpretation None       Radiology No results found.  Procedures Procedures (including critical care time)  Medications Ordered in ED Medications  oxyCODONE-acetaminophen (PERCOCET/ROXICET) 5-325 MG per tablet 1 tablet (1 tablet Oral Given 04/12/16 1301)  predniSONE (DELTASONE) tablet 60 mg (60 mg Oral Given 04/12/16 1301)     Initial Impression / Assessment and Plan / ED Course  I have reviewed the triage vital signs and the nursing notes.  Pertinent labs & imaging results that were available during my care of the patient were reviewed by me and considered in my medical decision making (see chart for details).  Clinical Course    60 year old male here with left lower back pain.  No recent injury, trauma, or falls. He is afebrile and nontoxic. Symptoms and physical exam findings are concerning for lumbar radiculopathy. He has no focal deficits to suggest cauda equina. Without acute injury or fall, suspicion for vertebral fracture or subluxation is low and imaging is deferred at this time. Patient was treated here with Percocet and prednisone with significant improvement of his pain. He has been ambulating in the ED without difficulty.  Remains neurologically intact. Will discharge home with continued symptomatically care. I have recommended that he follow-up with his PCP.  Discussed plan with patient, he acknowledged understanding and agreed with plan of care.  Return precautions given for new or worsening symptoms.  Final Clinical Impressions(s) / ED Diagnoses   Final diagnoses:  Lumbar radiculopathy    New Prescriptions New Prescriptions   OXYCODONE-ACETAMINOPHEN (PERCOCET/ROXICET) 5-325 MG TABLET    Take 1 tablet by mouth every 4 (four) hours as needed.   PREDNISONE (DELTASONE) 20 MG TABLET    Take 40 mg by mouth daily for 3 days, then 20mg  by mouth daily for 3 days, then 10mg  daily for 3 days     Larene Pickett, PA-C 04/12/16 [place], MD 04/12/16 2059

## 2016-04-12 NOTE — Discharge Instructions (Signed)
Take the prescribed medication as directed.  Do not drive while taking percocet, it can make you drowsy. Follow-up with your primary care doctor. Return to the ED for new or worsening symptoms.

## 2016-04-12 NOTE — ED Triage Notes (Signed)
EMS transport from home. Per EMS pt c/o back pain since Saturday, progressively worsening. Similar episode this spring. Denies falls/injury

## 2016-09-30 IMAGING — CT CT ABD-PELV W/ CM
4 of 5 series · 10 of 46 positions shown, 15 images · IV contrast (APPLIED)
Comparison: Abdomen ultrasound dated 07/27/2014.

CLINICAL DATA: Fever. Leukocytosis. Abdominal pain for the past
week.

EXAM:
CT CHEST, ABDOMEN, AND PELVIS WITH CONTRAST
TECHNIQUE: Multidetector CT imaging of the chest, abdomen and pelvis was
performed following the standard protocol during bolus
administration of intravenous contrast.
CONTRAST:  100mL OMNIPAQUE IOHEXOL 300 MG/ML  SOLN

[Series 2: chest/abd/pel 5.0 b31f · axial · 0.79mm/px · z∈[+750,+980]mm · 4 of 139 slices shown]
[im 16/139  soft-tissue]
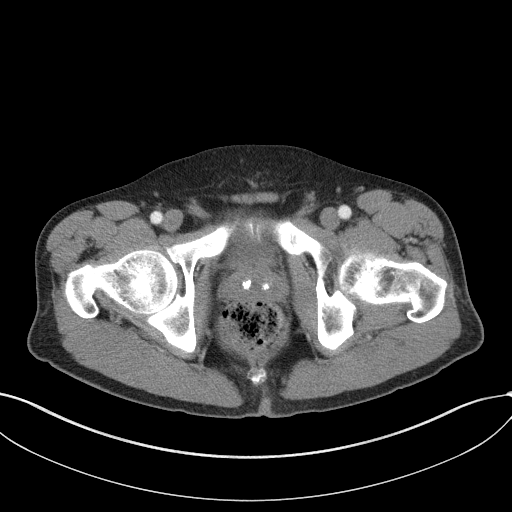
[im 31/139  soft-tissue]
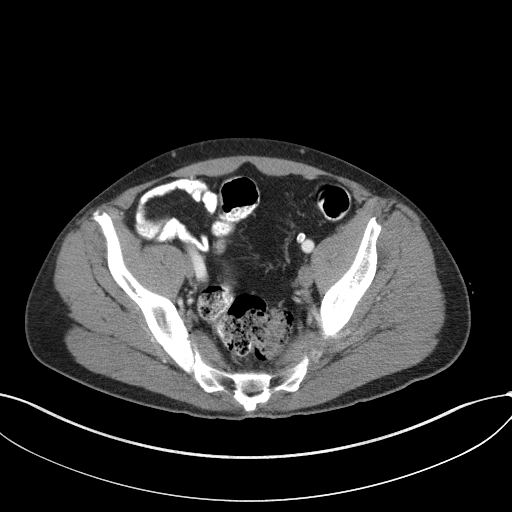
[im 47/139  soft-tissue]
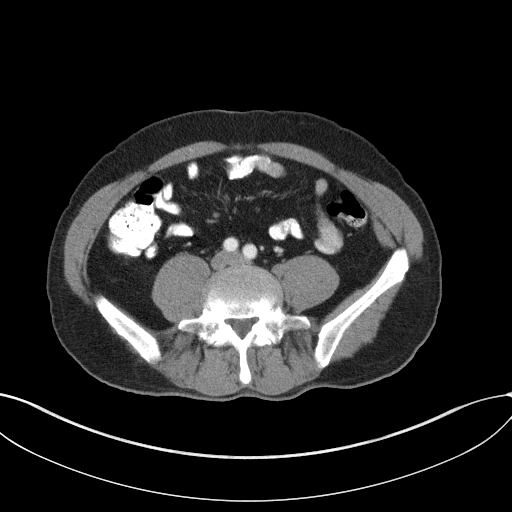
[im 62/139  soft-tissue]
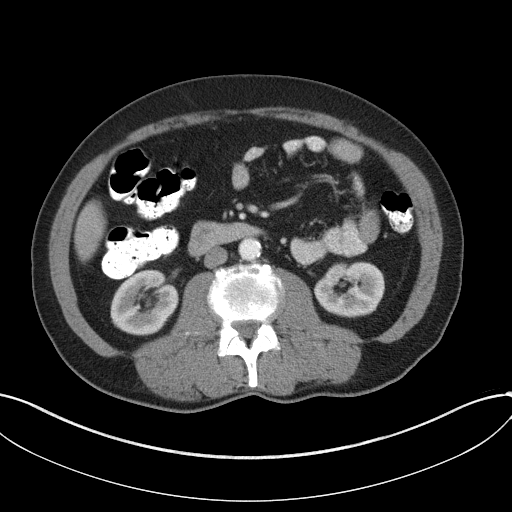

[Series 5: chest/abd/pel 3.0 coronal · coronal · 0.86mm/px · 3 of 94 slices shown, 4 images]
[im 32/94  soft-tissue]
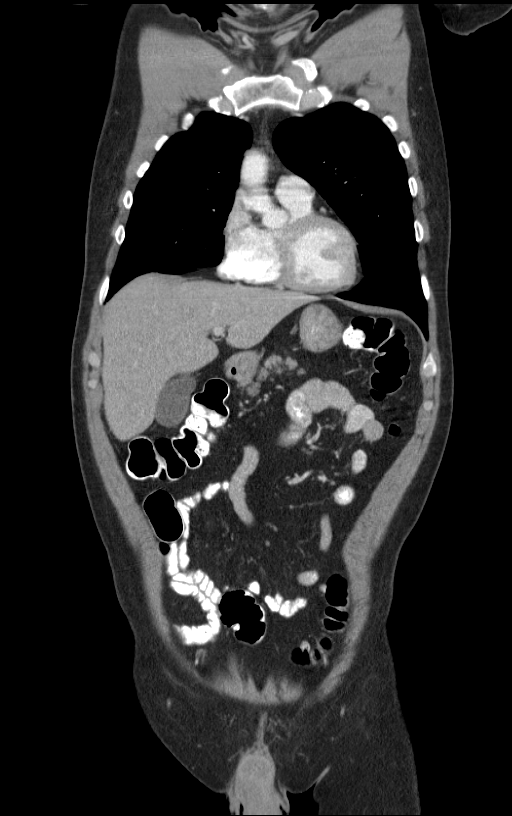
[im 42/94  soft-tissue]
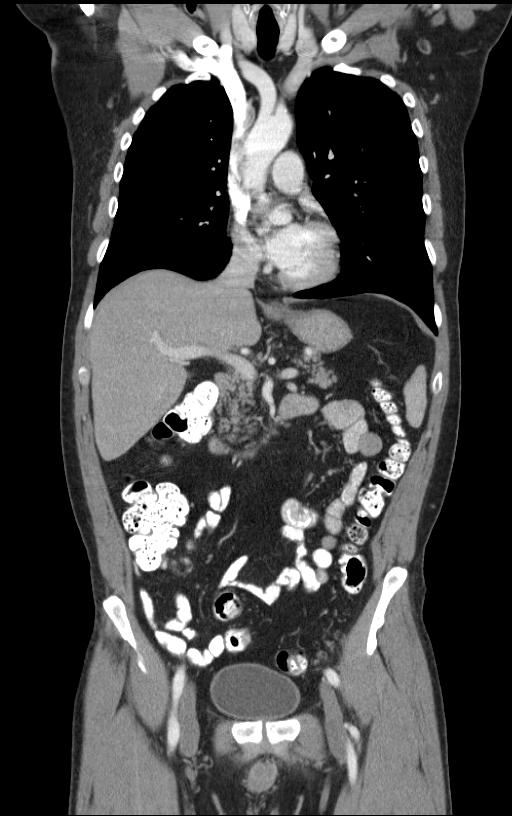
[im 42/94  bone]
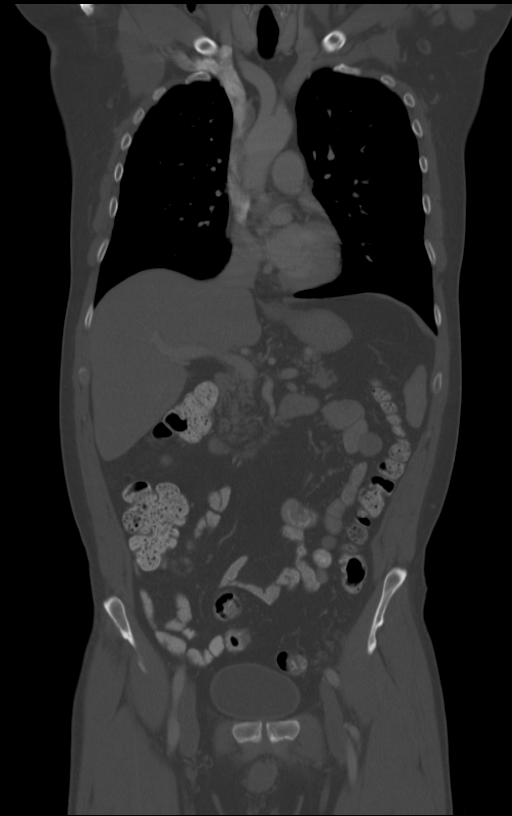
[im 52/94  soft-tissue]
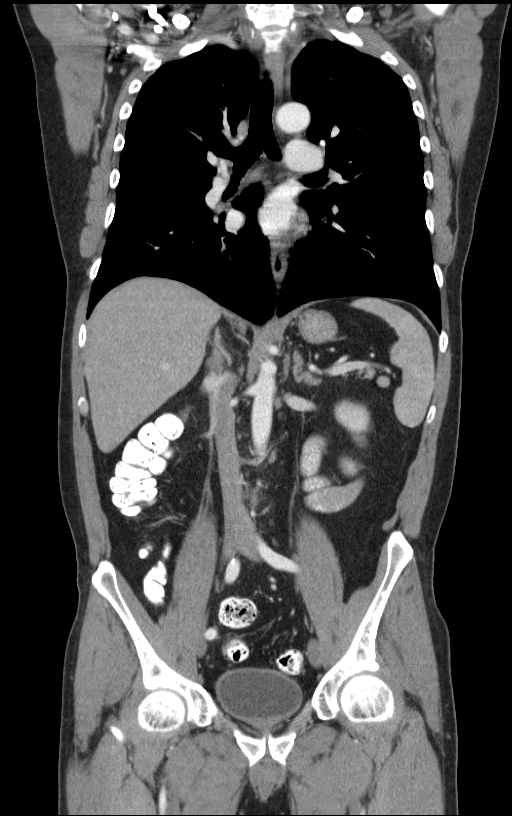

[Series 6: chest/abd/pel 3.0 sagittal · sagittal · 0.68mm/px · 1 of 121 slices shown, 2 images]
[im 41/121  soft-tissue]
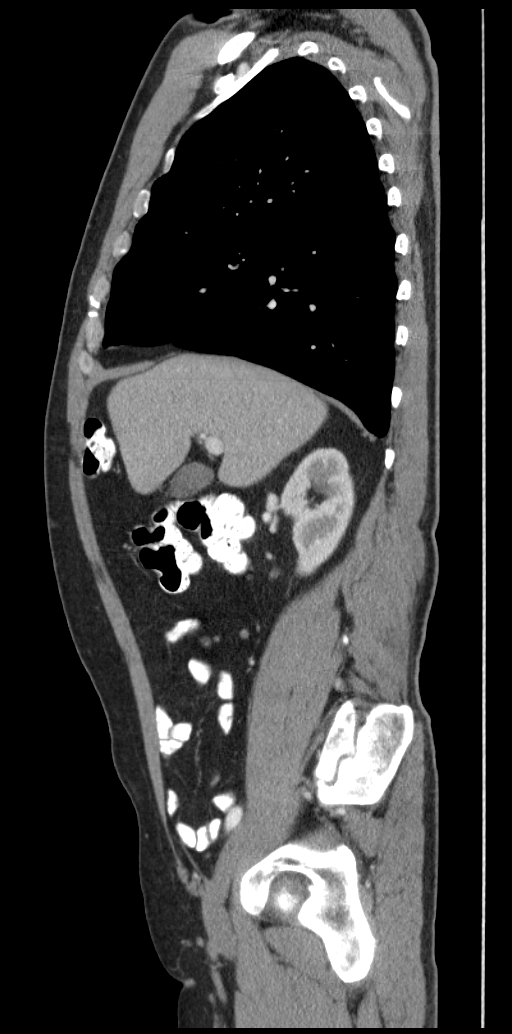
[im 41/121  bone]
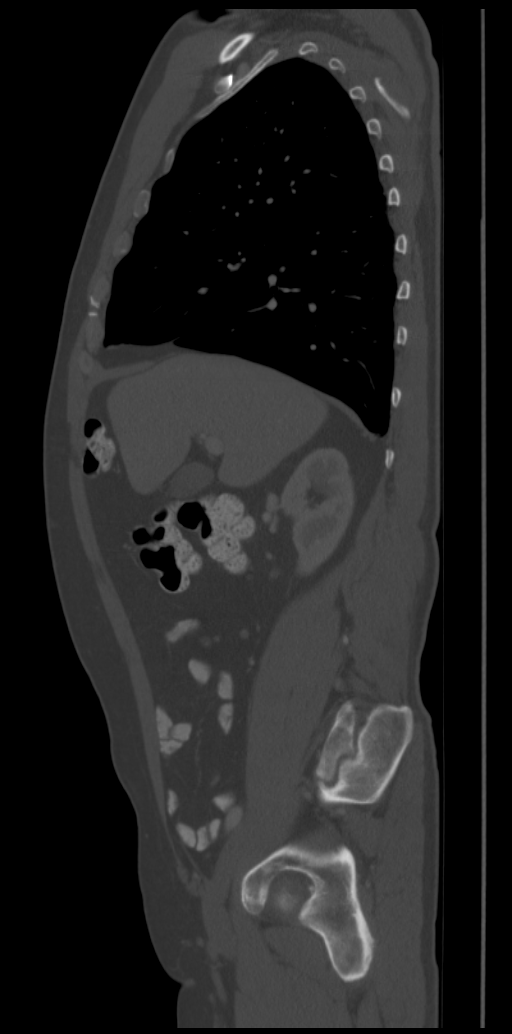

[Series 7: renal delay 5.0 b30f · axial · delayed · 0.80mm/px · z∈[+967,+1017]mm · 2 of 30 slices shown, 5 images]
[im 10/30  soft-tissue]
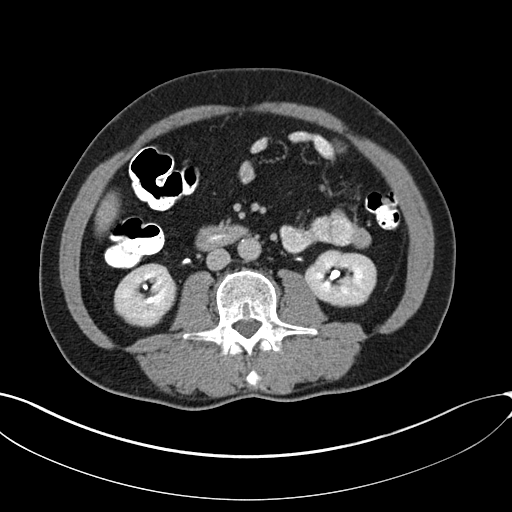
[im 10/30  lung]
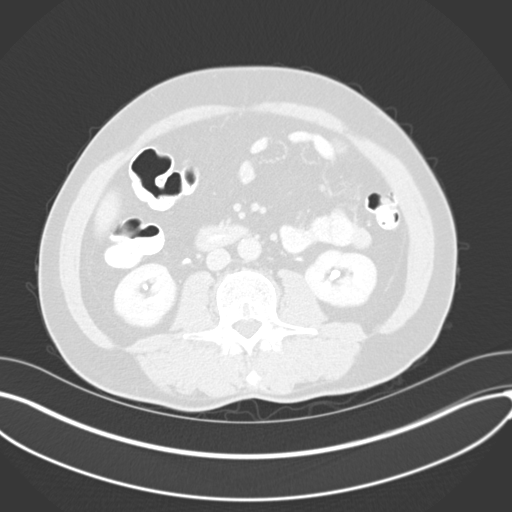
[im 10/30  bone]
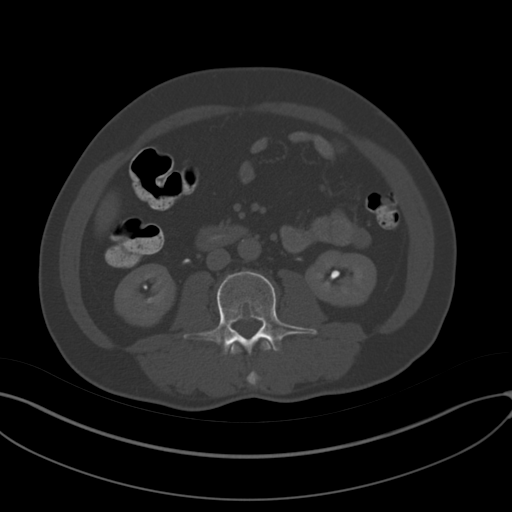
[im 20/30  soft-tissue]
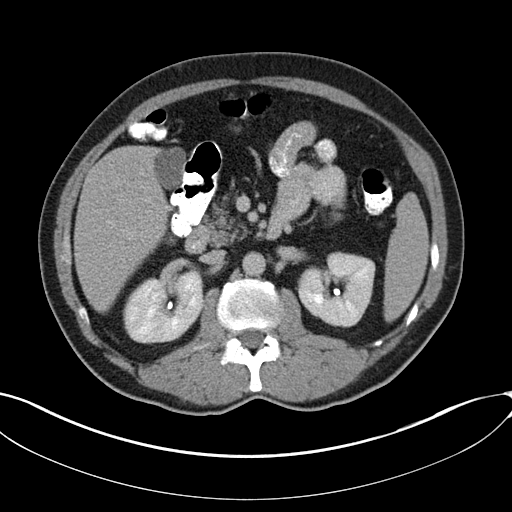
[im 20/30  lung]
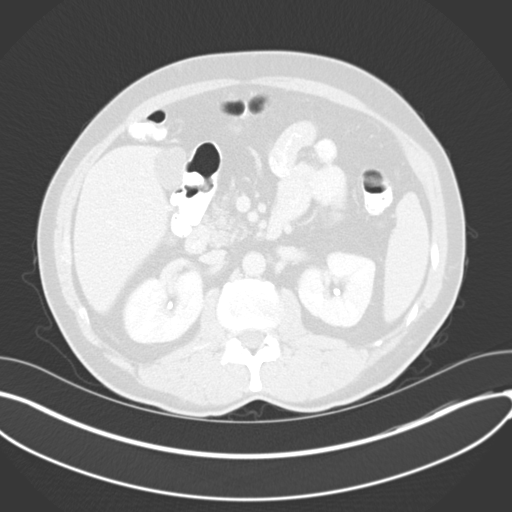

[10 of 46 positions shown; findings below may reference images not displayed]

FINDINGS: CT CHEST FINDINGS

Clear lungs. No lung nodules or enlarged lymph nodes. No pleural
fluid. Thoracic spine degenerative changes. Minimal linear scarring
in the lingula.

CT ABDOMEN AND PELVIS FINDINGS

Mild diffuse low density of the liver relative to the spleen. Normal
appearing spleen, pancreas, gallbladder, adrenal glands, right
kidney and urinary bladder. Mildly enlarged prostate gland
containing coarse calcifications.

There is an oval, solid enhancing mass in the posterior aspect of
the upper pole of the left kidney. This measures 1.8 x 1.6 cm on
image number 64 of series 2. This demonstrates some washout of
contrast on the delayed images through the kidney on image number 7
of series 7.

No enlarged lymph nodes. Small accessory spleen. Prominent stool
distending the rectum and distal sigmoid colon. Stool throughout the
majority of the remainder the colon. No gastric or small bowel
abnormalities. No evidence of appendicitis. Mild lumbar spine
degenerative changes.
IMPRESSION: 1. 1.8 cm solid enhancing mass in the upper pole of the left kidney,
posteriorly. This could represent a benign or malignant neoplasm.
2. Mild diffuse hepatic steatosis.
These results will be called to the ordering clinician or
representative by the Radiologist Assistant, and communication
documented in the PACS or zVision Dashboard.

## 2016-11-17 ENCOUNTER — Other Ambulatory Visit (HOSPITAL_COMMUNITY): Payer: Self-pay | Admitting: Interventional Radiology

## 2016-11-17 DIAGNOSIS — N2889 Other specified disorders of kidney and ureter: Secondary | ICD-10-CM

## 2016-11-21 ENCOUNTER — Other Ambulatory Visit: Payer: Self-pay | Admitting: *Deleted

## 2016-11-21 DIAGNOSIS — C642 Malignant neoplasm of left kidney, except renal pelvis: Secondary | ICD-10-CM

## 2017-03-07 ENCOUNTER — Encounter: Payer: Self-pay | Admitting: Radiology

## 2020-05-28 ENCOUNTER — Other Ambulatory Visit: Payer: Self-pay

## 2020-05-28 ENCOUNTER — Emergency Department (HOSPITAL_BASED_OUTPATIENT_CLINIC_OR_DEPARTMENT_OTHER)
Admission: EM | Admit: 2020-05-28 | Discharge: 2020-05-28 | Disposition: A | Discharge status: Home / Self Care | Payer: MEDICARE | Attending: Emergency Medicine | Admitting: Emergency Medicine

## 2020-05-28 ENCOUNTER — Other Ambulatory Visit (HOSPITAL_BASED_OUTPATIENT_CLINIC_OR_DEPARTMENT_OTHER): Payer: Self-pay | Admitting: Family Medicine

## 2020-05-28 DIAGNOSIS — Z85528 Personal history of other malignant neoplasm of kidney: Secondary | ICD-10-CM | POA: Insufficient documentation

## 2020-05-28 DIAGNOSIS — M5416 Radiculopathy, lumbar region: Secondary | ICD-10-CM

## 2020-05-28 DIAGNOSIS — Z7984 Long term (current) use of oral hypoglycemic drugs: Secondary | ICD-10-CM | POA: Diagnosis not present

## 2020-05-28 DIAGNOSIS — Z7982 Long term (current) use of aspirin: Secondary | ICD-10-CM | POA: Insufficient documentation

## 2020-05-28 DIAGNOSIS — M25551 Pain in right hip: Secondary | ICD-10-CM | POA: Insufficient documentation

## 2020-05-28 DIAGNOSIS — Z79899 Other long term (current) drug therapy: Secondary | ICD-10-CM | POA: Diagnosis not present

## 2020-05-28 DIAGNOSIS — I10 Essential (primary) hypertension: Secondary | ICD-10-CM | POA: Diagnosis not present

## 2020-05-28 DIAGNOSIS — M545 Low back pain, unspecified: Secondary | ICD-10-CM | POA: Diagnosis present

## 2020-05-28 DIAGNOSIS — E119 Type 2 diabetes mellitus without complications: Secondary | ICD-10-CM | POA: Insufficient documentation

## 2020-05-28 MED ORDER — CYCLOBENZAPRINE HCL 10 MG PO TABS: 10.0000 mg | ORAL_TABLET | Freq: Three times a day (TID) | ORAL | 0 refills | Status: DC | PRN

## 2020-05-28 MED ORDER — METHYLPREDNISOLONE 4 MG PO TBPK: ORAL_TABLET | ORAL | 0 refills | Status: DC

## 2020-05-28 MED ORDER — KETOROLAC TROMETHAMINE 30 MG/ML IJ SOLN
60.0000 mg | Freq: Once | INTRAMUSCULAR | Status: AC
  Administered 2020-05-28: 60 mg via INTRAMUSCULAR
  Filled 2020-05-28: qty 2

## 2020-05-28 MED FILL — METHYLPREDNISOLONE 4 MG TBP: 4 | 6 days supply | Qty: 21 | Fill #0

## 2020-05-28 MED FILL — CYCLOBENZAPRINE HCL 10 MG T: 10 | 10 days supply | Qty: 30 | Fill #0

## 2020-05-28 NOTE — ED Provider Notes (Signed)
Terry Hunt Provider Note   CSN: [id number] Arrival date & time: 05/28/20  1443     History Chief Complaint  Patient presents with  . Back Pain    Terry Hunt is a 65 y.o. male.  Patient reports that he began to have more right hip pain.  Radiated to his right knee 5 days ago.  He denies any fevers or chills.  He denies any recent trauma or falls.  He states that at baseline ambulates independently without assistance devices.  Patient states that pain sometimes will radiate to his right ankle.  He does not report any swelling or bruising.  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  He denies any lower extremity weakness or bowel/bladder incontinence or changes to sensation in his lower extremity.  He reports sometimes his right knee has a numb-like sensation, specifically denies paresthesias.  He reports that he is able to ambulate but this is limited secondary to pain.  Patient has hx of renal cancer that was treated with cryoablation in 2017.        Past Medical History:  Diagnosis Date  . Cancer ([place])   . Diabetes mellitus without complication ([place])   . Hypercholesterolemia   . Hypertension   . Left renal mass     Patient Active Problem List   Diagnosis Date Noted  . Left renal mass     Past Surgical History:  Procedure Laterality Date  . IR GENERIC HISTORICAL  02/25/2016   IR RADIOLOGIST EVAL & MGMT 02/25/2016 Greggory Keen, MD [facility] INTERV RAD  . Left hand closed reduction    1998    . RENAL CRYOABLATION         No family history on file.  Social History   Tobacco Use  . Smoking status: Never Smoker  . Smokeless tobacco: Never Used  Substance Use Topics  . Alcohol use: No    Alcohol/week: 0.0 standard drinks  . Drug use: No    Home Medications Prior to Admission medications   Medication Sig Start Date End Date Taking?  Authorizing Provider  cyclobenzaprine (FLEXERIL) 10 MG tablet Take 1 tablet (10 mg total) by mouth 3 (three) times daily as needed for muscle spasms. 05/28/20  Yes Simmons-Robinson, Roni Scow, MD  methylPREDNISolone (MEDROL DOSEPAK) 4 MG TBPK tablet Per pack instructions 05/28/20  Yes Simmons-Robinson, Jessye Imhoff, MD  aspirin 81 MG tablet Take 81 mg by mouth daily.    [provider]  atorvastatin (LIPITOR) 20 MG tablet Take 40 mg by mouth daily.     [provider]  esomeprazole (NEXIUM) 40 MG capsule Take 40 mg by mouth daily at 12 noon.    [provider]  HYDROcodone-acetaminophen (NORCO) 5-325 MG tablet Take 1-2 tablets by mouth every 6 (six) hours as needed. Patient not taking: Reported on 02/25/2016 09/27/15   Veryl Speak, MD  losartan-hydrochlorothiazide (HYZAAR) 100-25 MG per tablet Take 1 tablet by mouth daily.    [provider]  metFORMIN (GLUCOPHAGE) 500 MG tablet Take by mouth 2 (two) times daily with a meal.    [provider]  metoprolol succinate (TOPROL-XL) 25 MG 24 hr tablet Take 25 mg by mouth daily.    [provider]  simvastatin (ZOCOR) 20 MG tablet Take 20 mg by mouth daily.    [provider]    Allergies    Patient  has no known allergies.  Review of Systems   Review of Systems  Constitutional: Negative for chills, fatigue and fever.  Respiratory: Negative for shortness of breath.   Cardiovascular: Negative for chest pain.  Gastrointestinal: Negative for abdominal pain.       No bowel incontinence    Genitourinary: Negative for dysuria, hematuria, penile pain and urgency.  Musculoskeletal: Positive for back pain and gait problem. Negative for neck pain and neck stiffness.  Skin: Negative for rash and wound.  Allergic/Immunologic: Negative for immunocompromised state.  Neurological: Negative for dizziness, tremors, weakness and headaches.   Physical Exam Updated Vital Signs BP (!) 126/99 (BP Location:  Left Arm)   Pulse 81   Temp 97.8 F (36.6 C)   Resp 18   Ht 6' (1.829 m)   Wt 83.9 kg   SpO2 97%   BMI 25.09 kg/m   Physical Exam Constitutional:      Comments: Uncomfortable appearing, leaning to left side while sitting in wheel chair, patient able to ambulation but shifts right foot instead of walking with heel strike   Cardiovascular:     Rate and Rhythm: Normal rate. Rhythm irregular.     Pulses: Normal pulses.  Pulmonary:     Effort: Pulmonary effort is normal. No respiratory distress.     Breath sounds: Normal breath sounds. No wheezing or rales.  Abdominal:     General: Bowel sounds are normal. There is no distension.     Palpations: Abdomen is soft.     Tenderness: There is no abdominal tenderness.  Musculoskeletal:     Cervical back: Normal.     Thoracic back: Normal.     Lumbar back: Tenderness present. No swelling, edema, signs of trauma, lacerations or bony tenderness. Negative right straight leg raise test.       Back:     Right lower leg: Tenderness present. No lacerations. 1+ Pitting Edema present.     Left lower leg: No lacerations or tenderness. 1+ Pitting Edema present.       Legs:  Skin:    Findings: No erythema.  Neurological:     Mental Status: He is alert and oriented to person, place, and time.     Sensory: Sensation is intact.     Coordination: Coordination normal.     Gait: Gait abnormal.     ED Results / Procedures / Treatments   Labs (all labs ordered are listed, but only abnormal results are displayed) Labs Reviewed - No data to display  EKG None  Radiology No results found.  Procedures Procedures   Medications Ordered in ED Medications  ketorolac (TORADOL) 30 MG/ML injection 60 mg (60 mg Intramuscular Given 05/28/20 1244)    ED Course  I have reviewed the triage vital signs and the nursing notes.  Pertinent labs & imaging results that were available during my care of the patient were reviewed by me and considered in my  medical decision making (see chart for details).    MDM Rules/Calculators/A&P                          Terry Hunt is a 65 y.o. male presenting with right sided lumbar back pain that is radiating to his  RLE. Patient has MRI results from 2018 noting bulging disc  in L4 - L5 with broad L4 nerve impingement. Highly suspicious that this right sided pain is result of now right sided symptoms from this finding. Patient reports undergoing  spinal surgery to treat symptoms. Lack of consitutional symptoms including unintentional weight loss, night sweats fevers or chills lowers suspicion for acute infection causing symptoms so do not think he needs lumbar puncture. Distrubution of pain radiation is consistent with L4 pathology. Could also consider development of spinal stenosis. Differential includes spinal stenosis (no significant pain with straight leg raise and non improved with sitting), does not appear to be consistent with exertional pain so less likely vascular pain, no bladder or bowel incontinence that would raise concern for cauda equina syndrome, could also consider piriformis syndrome but given clinical history and no recent traumatic incident, consider as less likely cause. Will treat patient's pain and have him schedule outpatient follow up with PCP and prior neurosurgeon  for evaluation of likely right sided  L4 impingement. Will plan to discharge patient with follow up as outpatient, medrol dose pack and muscle relaxant after dose of IM Toradol.   Final Clinical Impression(s) / ED Diagnoses Final diagnoses:  Lumbar radiculopathy    Rx / DC Orders ED Discharge Orders         Ordered    cyclobenzaprine (FLEXERIL) 10 MG tablet  3 times daily PRN        05/28/20 1218    methylPREDNISolone (MEDROL DOSEPAK) 4 MG TBPK tablet        05/28/20 1218           Simmons-Robinson, Riki Sheer, MD 05/28/20 1313    Gareth Morgan, MD 05/31/20 1107

## 2020-05-28 NOTE — ED Triage Notes (Signed)
Pt arrived by [facility]. Pt reports awoke x 5 days ago with back pain that radiates down RLE. Pt reports that yesterday his pain has become more constant.

## 2020-05-28 NOTE — Discharge Instructions (Addendum)
You were evaluated for back pain is likely due to nerve impingement your back.  We have prescribed steroid course and muscle relaxer to help with your pain.  You were treated with an intramuscular pain medication.  Recommend that you follow-up with your primary neurosurgeon for further evaluation.  In the meantime, I recommend following up with your primary care doctor arrange outpatient imaging.  Please return to care care if experience inability to control your bladder or bowels, fever or inability to walk.   Take Tylenol 1000 mg 4 times a day for 1 week. This is the maximum dose of Tylenol usually take from all sources. Please check other over-the-counter medications and prescriptions to ensure you are not taking other medications that contain acetaminophen.  You may also take ibuprofen 400 mg 6 times a day alternating with or at the same time as tylenol OR ibuprofen 600mg  4 times a day.

## 2020-06-04 ENCOUNTER — Other Ambulatory Visit (HOSPITAL_BASED_OUTPATIENT_CLINIC_OR_DEPARTMENT_OTHER): Payer: Self-pay | Admitting: Neurosurgery

## 2020-06-04 DIAGNOSIS — M5416 Radiculopathy, lumbar region: Secondary | ICD-10-CM

## 2020-06-13 ENCOUNTER — Ambulatory Visit (HOSPITAL_BASED_OUTPATIENT_CLINIC_OR_DEPARTMENT_OTHER)
Admission: RE | Admit: 2020-06-13 | Discharge: 2020-06-13 | Disposition: A | Discharge status: Home / Self Care | Payer: MEDICARE | Source: Ambulatory Visit | Attending: Neurosurgery | Admitting: Neurosurgery

## 2020-06-13 ENCOUNTER — Other Ambulatory Visit: Payer: Self-pay

## 2020-06-13 DIAGNOSIS — M5416 Radiculopathy, lumbar region: Secondary | ICD-10-CM | POA: Insufficient documentation

## 2020-06-13 MED ORDER — GADOBUTROL 1 MMOL/ML IV SOLN
8.0000 mL | Freq: Once | INTRAVENOUS | Status: AC | PRN
  Administered 2020-06-13: 8 mL via INTRAVENOUS
